# Patient Record
Sex: Female | Born: 1940 | Race: White | Hispanic: No
Health system: Southern US, Community
[De-identification: ages and names within clinical notes are randomized; demographics above are authoritative.]

---

## 2003-05-08 ENCOUNTER — Ambulatory Visit (HOSPITAL_COMMUNITY): Admission: RE | Admit: 2003-05-08 | Discharge: 2003-05-08 | Payer: Self-pay | Admitting: *Deleted

## 2004-12-11 ENCOUNTER — Ambulatory Visit: Payer: Self-pay

## 2005-01-12 ENCOUNTER — Ambulatory Visit: Payer: Self-pay | Admitting: Internal Medicine

## 2006-01-17 ENCOUNTER — Ambulatory Visit: Payer: Self-pay | Admitting: Internal Medicine

## 2006-05-05 ENCOUNTER — Ambulatory Visit: Payer: Self-pay

## 2006-06-02 ENCOUNTER — Ambulatory Visit: Payer: Self-pay | Admitting: Internal Medicine

## 2006-11-09 ENCOUNTER — Ambulatory Visit: Payer: Self-pay | Admitting: Unknown Physician Specialty

## 2006-12-06 ENCOUNTER — Ambulatory Visit: Payer: Self-pay | Admitting: Unknown Physician Specialty

## 2007-01-09 ENCOUNTER — Other Ambulatory Visit: Payer: Self-pay

## 2007-01-10 ENCOUNTER — Inpatient Hospital Stay: Payer: Self-pay | Admitting: Internal Medicine

## 2007-02-16 ENCOUNTER — Ambulatory Visit: Payer: Self-pay | Admitting: Unknown Physician Specialty

## 2007-05-12 ENCOUNTER — Emergency Department: Payer: Self-pay | Admitting: Emergency Medicine

## 2007-05-25 ENCOUNTER — Ambulatory Visit: Payer: Self-pay | Admitting: Unknown Physician Specialty

## 2007-06-21 ENCOUNTER — Inpatient Hospital Stay: Payer: Self-pay | Admitting: Unknown Physician Specialty

## 2008-01-03 ENCOUNTER — Ambulatory Visit: Payer: Self-pay | Admitting: Unknown Physician Specialty

## 2008-01-05 ENCOUNTER — Ambulatory Visit: Payer: Self-pay | Admitting: Unknown Physician Specialty

## 2008-02-07 ENCOUNTER — Ambulatory Visit: Payer: Self-pay | Admitting: Internal Medicine

## 2008-03-04 ENCOUNTER — Ambulatory Visit: Payer: Self-pay | Admitting: Unknown Physician Specialty

## 2008-05-07 ENCOUNTER — Ambulatory Visit: Payer: Self-pay | Admitting: Oncology

## 2008-05-23 ENCOUNTER — Ambulatory Visit: Payer: Self-pay | Admitting: Maternal and Fetal Medicine

## 2008-06-06 ENCOUNTER — Ambulatory Visit: Payer: Self-pay | Admitting: Maternal and Fetal Medicine

## 2008-06-06 ENCOUNTER — Ambulatory Visit: Payer: Self-pay | Admitting: Oncology

## 2008-06-26 ENCOUNTER — Ambulatory Visit: Payer: Self-pay | Admitting: Unknown Physician Specialty

## 2008-07-10 ENCOUNTER — Ambulatory Visit: Payer: Self-pay | Admitting: Unknown Physician Specialty

## 2008-11-25 IMAGING — CT CT ABDOMEN WO/W CM
2 of 4 series · 14 of 32 positions shown, 19 images · non-contrast
Comparison: none

REASON FOR EXAM: left adrenal adenoma
COMMENTS:

[Series 2: soft tissue w/o · axial · non-contrast · 0.80mm/px · z∈[+240,+476]mm · 8 of 61 slices shown, 13 images]
[im 7/61  soft-tissue]
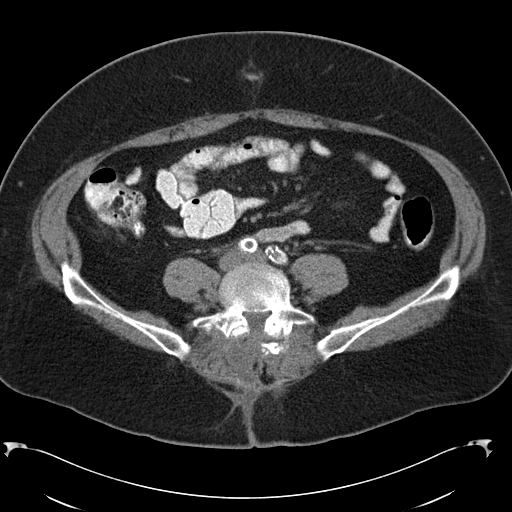
[im 7/61  bone]
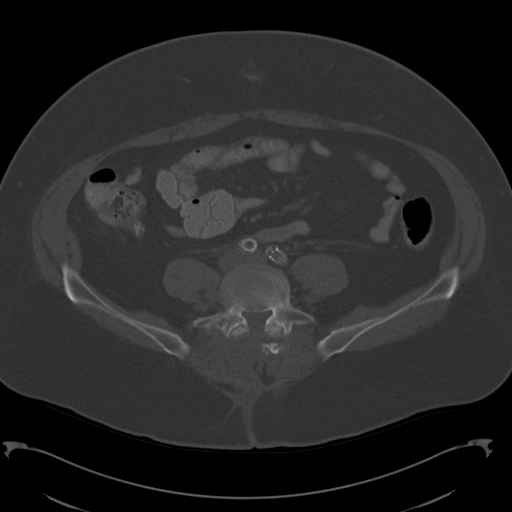
[im 14/61  soft-tissue]
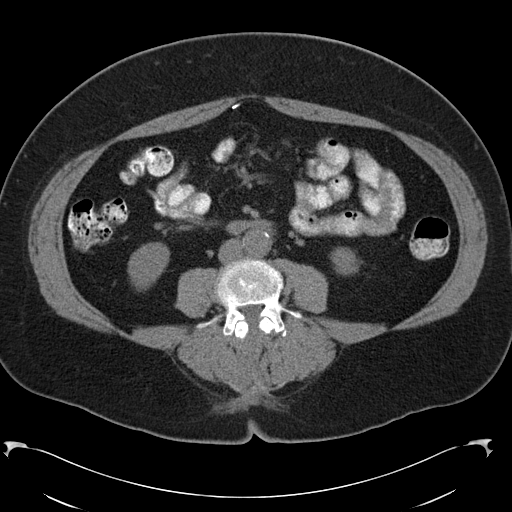
[im 21/61  soft-tissue]
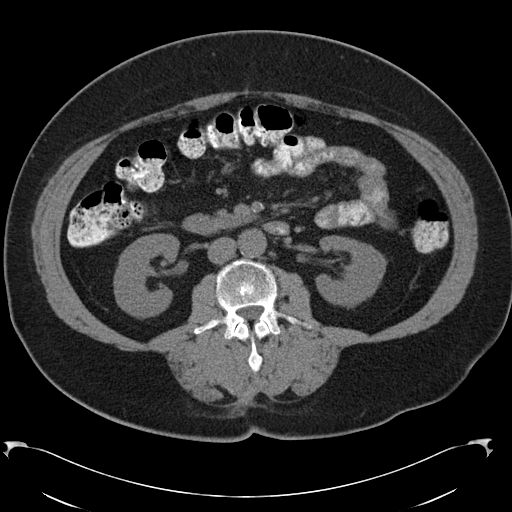
[im 27/61  soft-tissue]
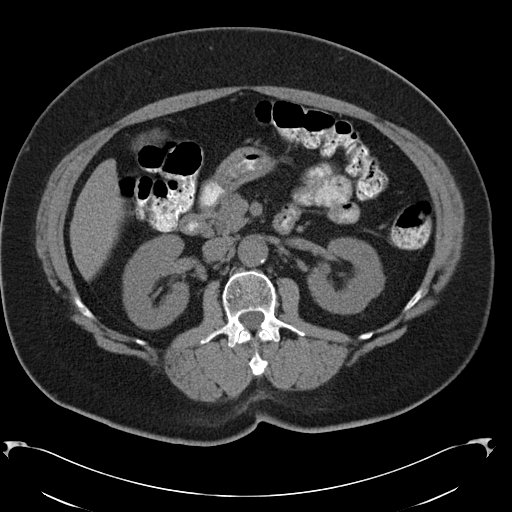
[im 34/61  soft-tissue]
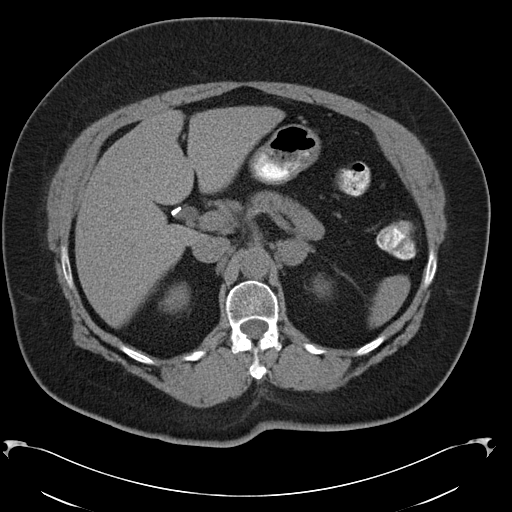
[im 34/61  lung]
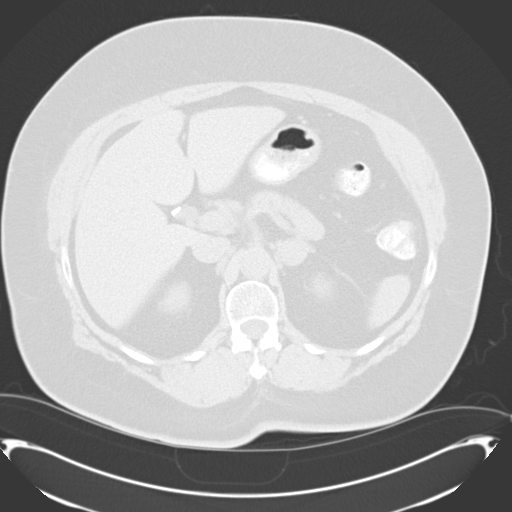
[im 41/61  soft-tissue]
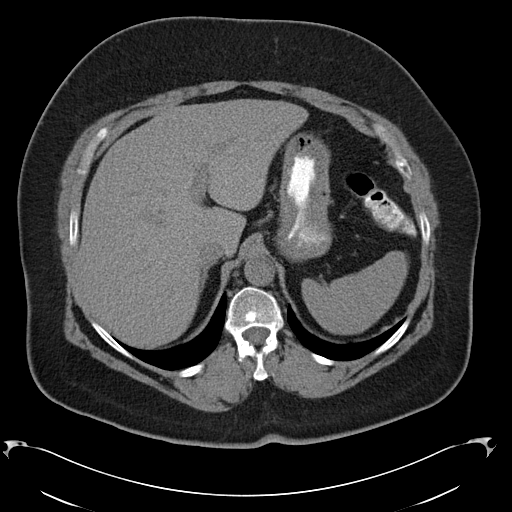
[im 41/61  lung]
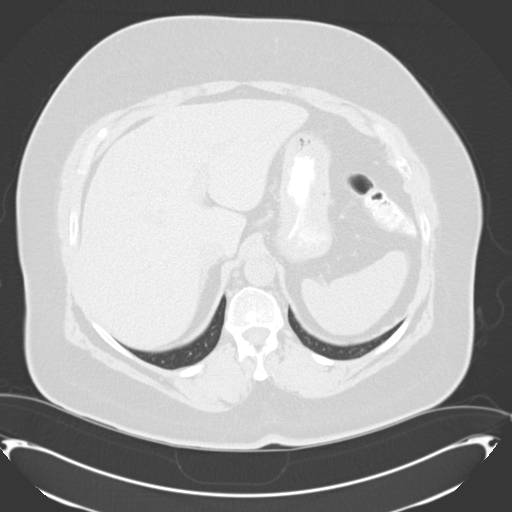
[im 47/61  soft-tissue]
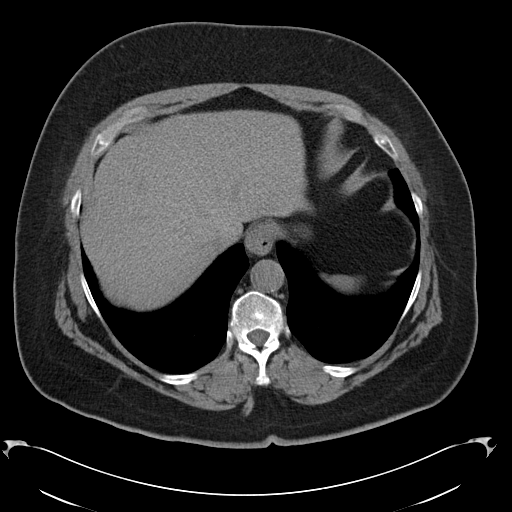
[im 47/61  lung]
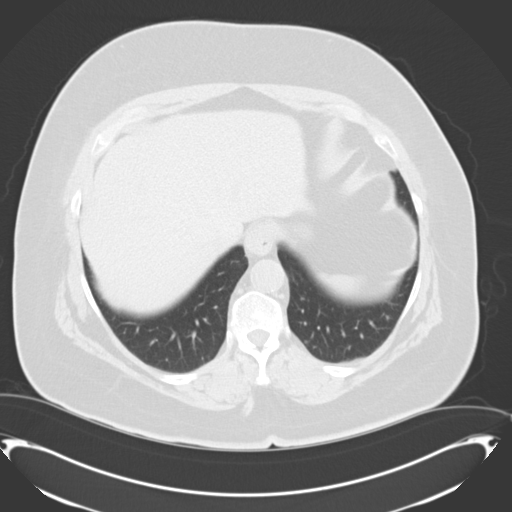
[im 54/61  soft-tissue]
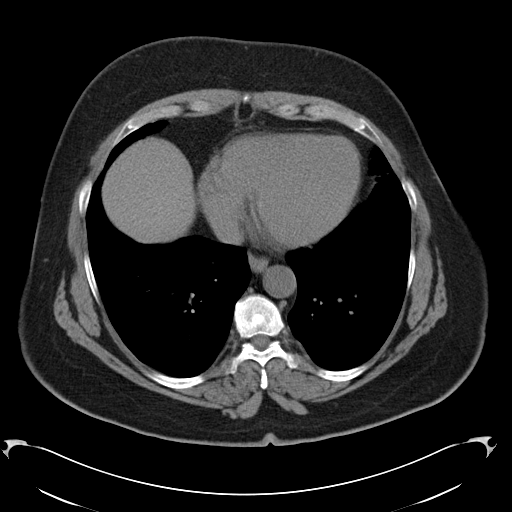
[im 54/61  lung]
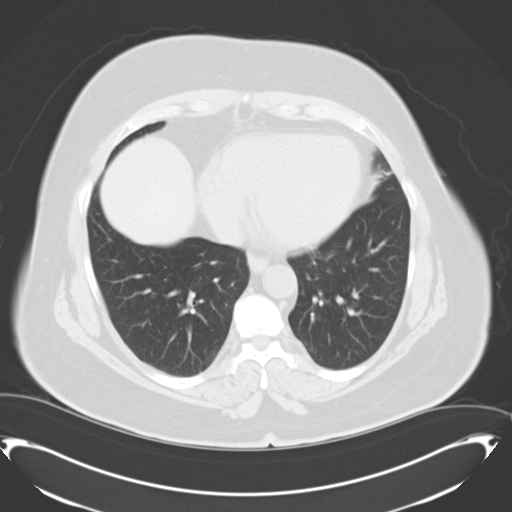

[Series 4: soft tissue with · axial · 0.80mm/px · z∈[+240,+430]mm · 6 of 62 slices shown]
[im 8/62  soft-tissue]
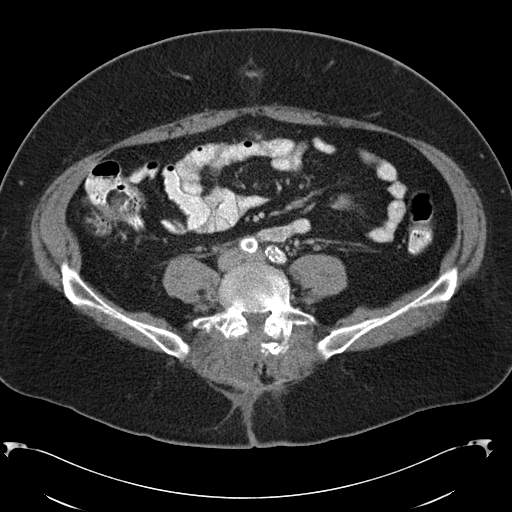
[im 16/62  soft-tissue]
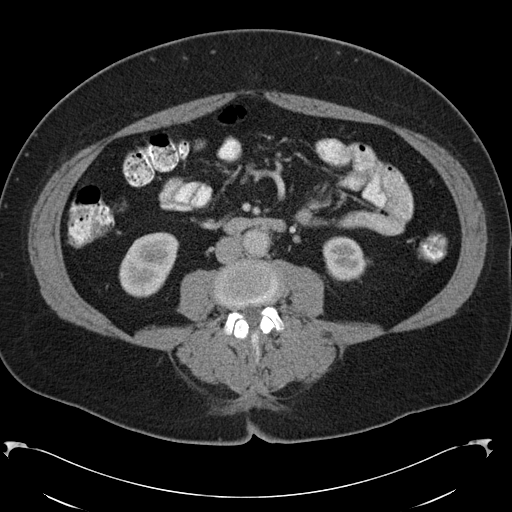
[im 23/62  soft-tissue]
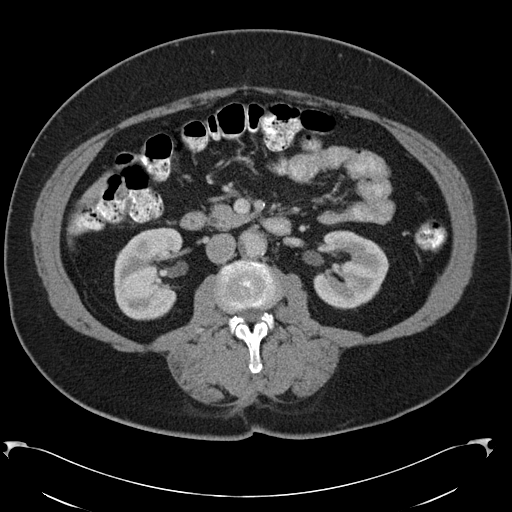
[im 31/62  soft-tissue]
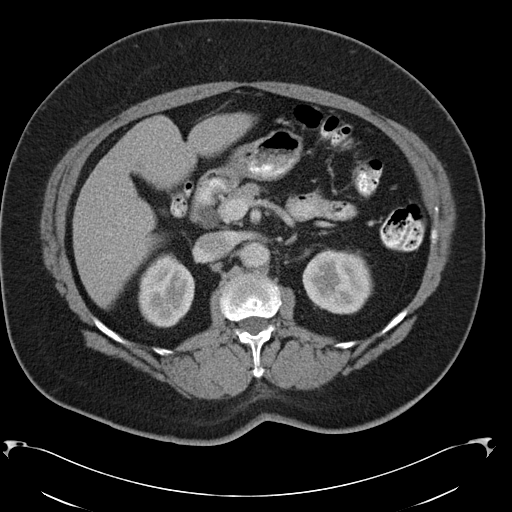
[im 39/62  soft-tissue]
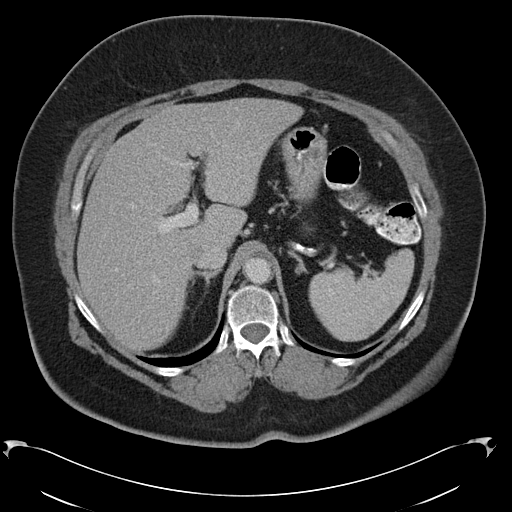
[im 46/62  soft-tissue]
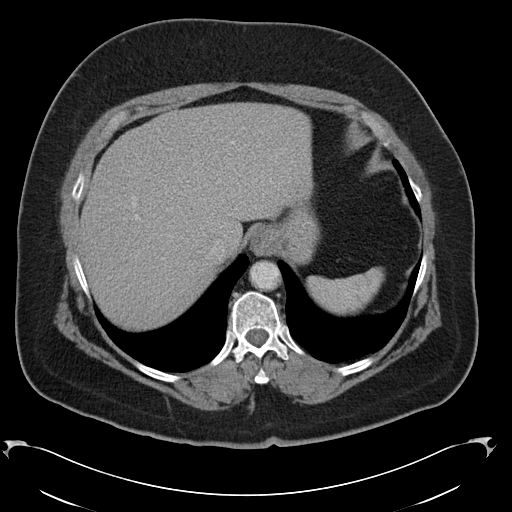

[14 of 32 positions shown; findings below may reference images not displayed]

PROCEDURE:     CT  - CT ABDOMEN STANDARD W/WO  - March 04, 2008  [DATE]

RESULT:      The patient undergoing imaging for presumed LEFT adrenal
adenoma.

On noncontrast images, there is a LEFT adrenal mass measuring 2.3 cm in
diameter. This has Hounsfield measurements of 23 precontrast, 72 post
contrast, and 35 on delayed images. It appears no more than slightly larger
than on a study 05 May, 2006. At that time its Hounsfield measurement
was approximately 30 on a standard contrast enhanced image.

The RIGHT adrenal gland is normal in appearance. The liver, spleen, stomach,
pancreas, and kidneys exhibit no acute abnormality. There is cortically
based hypodensity in the LEFT  kidney which is most compatible with an
approximately 8 x 12 mm diameter cyst with Hounsfield measurements of 5
precontrast, 23 postcontrast, and 17 on delayed images. The partially
contrast filled loops of small and large bowel are normal in appearance. The
patient has undergone placement of common iliac artery stents bilaterally.
The caliber of the abdominal aorta is normal.
IMPRESSION: 1. There is a stable appearing mass involving the LEFT adrenal gland. It
statistically is most compatible with an adrenal adenoma.
2. I do not see acute hepatobiliary abnormality. The gallbladder is
surgically absent and mild dilation of the common bile duct is seen which is
not unusual in the postcholecystectomy state.
3. There are findings most compatible with a cyst in the mid to upper pole
of the LEFT kidney laterally.
4. The lung bases are clear.

## 2009-05-07 ENCOUNTER — Ambulatory Visit: Payer: Self-pay | Admitting: Internal Medicine

## 2009-05-20 ENCOUNTER — Ambulatory Visit: Payer: Self-pay | Admitting: Internal Medicine

## 2010-02-14 ENCOUNTER — Ambulatory Visit: Payer: Self-pay | Admitting: Internal Medicine

## 2010-02-17 ENCOUNTER — Ambulatory Visit: Payer: Self-pay | Admitting: Internal Medicine

## 2010-05-20 ENCOUNTER — Ambulatory Visit: Payer: Self-pay | Admitting: Pain Medicine

## 2010-05-26 ENCOUNTER — Ambulatory Visit: Payer: Self-pay | Admitting: Internal Medicine

## 2010-05-27 ENCOUNTER — Ambulatory Visit: Payer: Self-pay | Admitting: Pain Medicine

## 2010-05-27 ENCOUNTER — Other Ambulatory Visit: Payer: Self-pay | Admitting: Pain Medicine

## 2010-06-09 ENCOUNTER — Ambulatory Visit: Payer: Self-pay | Admitting: Internal Medicine

## 2010-06-25 ENCOUNTER — Ambulatory Visit: Payer: Self-pay | Admitting: Pain Medicine

## 2010-08-04 ENCOUNTER — Ambulatory Visit: Payer: Self-pay | Admitting: Surgery

## 2010-08-11 ENCOUNTER — Ambulatory Visit: Payer: Self-pay | Admitting: Surgery

## 2010-08-13 LAB — PATHOLOGY REPORT

## 2011-03-20 ENCOUNTER — Inpatient Hospital Stay: Payer: Self-pay | Admitting: Internal Medicine

## 2011-12-11 IMAGING — CR DG CHEST 1V PORT
1 series · 1 of 1 positions shown · non-contrast
Comparison: none

REASON FOR EXAM: CP
COMMENTS:

[view not recorded]
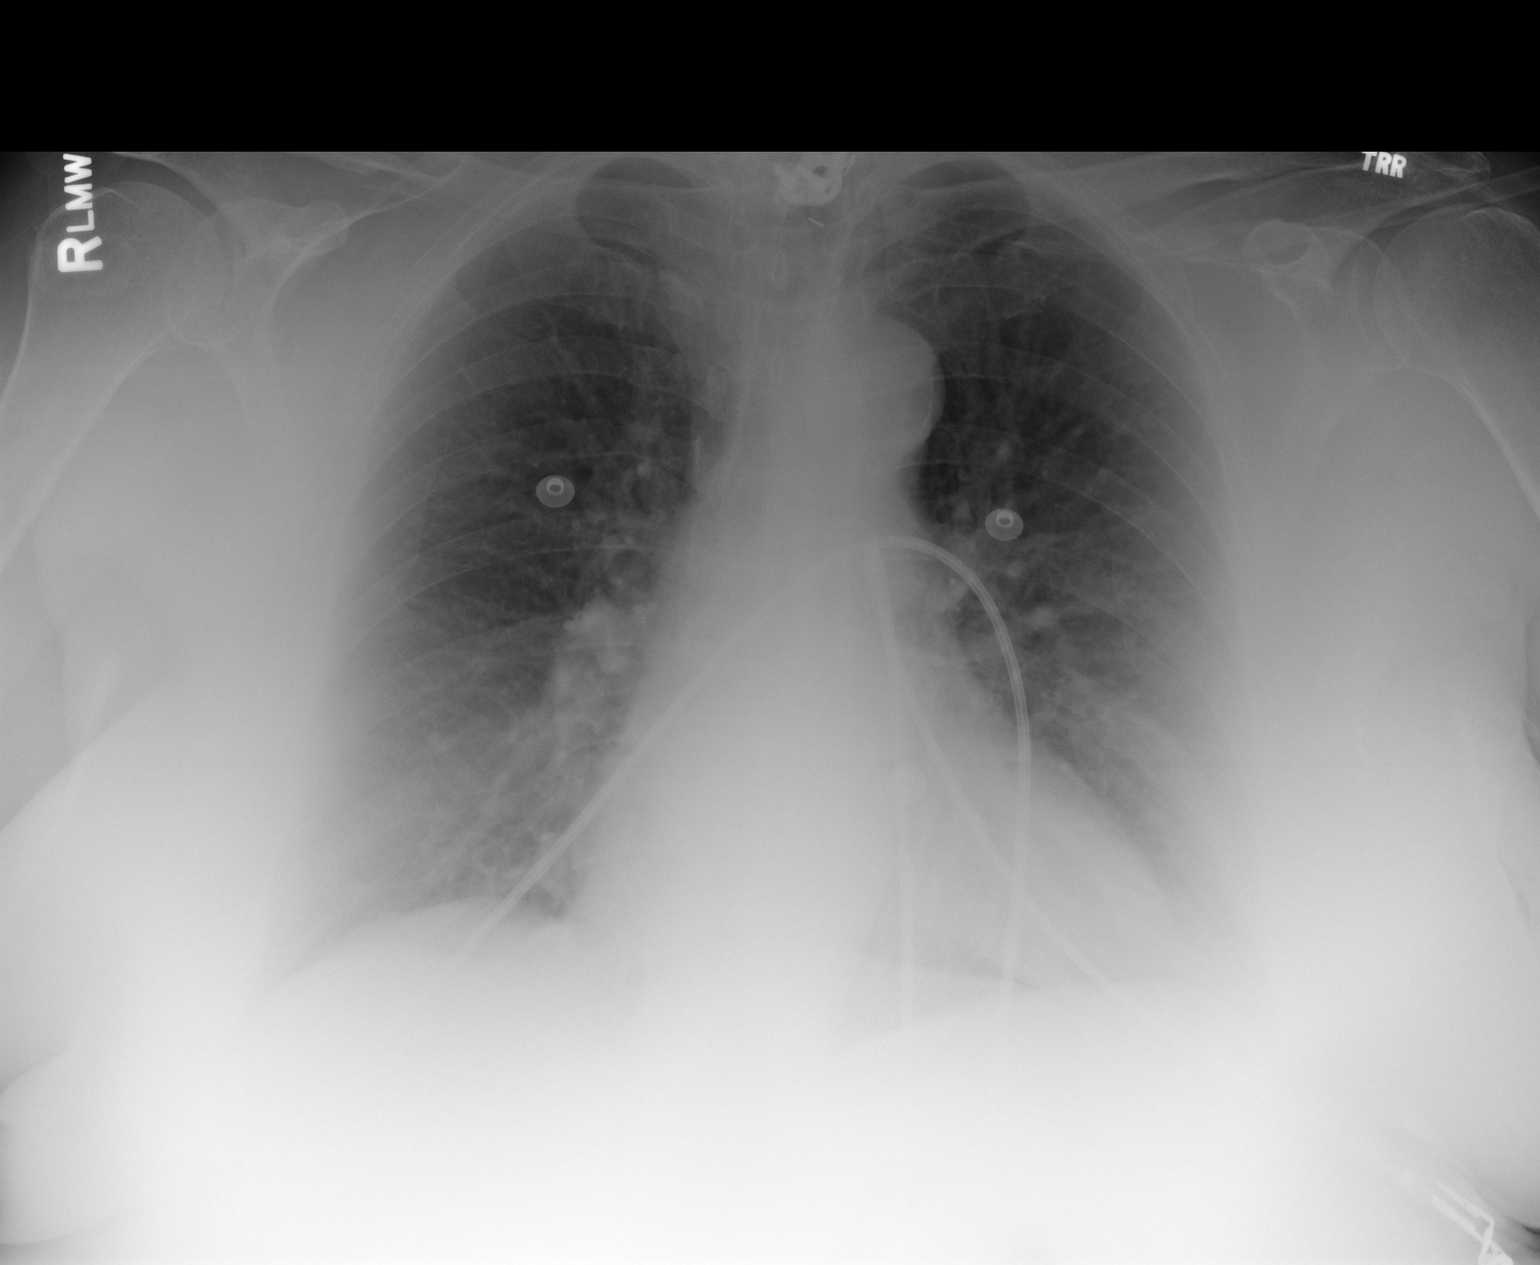

[1 of 1 positions shown; findings below may reference images not displayed]

PROCEDURE:     DXR - DXR PORTABLE CHEST SINGLE VIEW  - March 20, 2011 [DATE]

RESULT:     Comparison is made to the study 03 April, 2010.

The lungs are well expanded. The pulmonary interstitial markings are mildly
prominent as is the central pulmonary vascularity. The cardiac silhouette is
top normal in size. I see no pleural effusion nor alveolar infiltrate.
IMPRESSION: The study is somewhat limited due to the portable technique
and overlying soft tissues. However, I cannot exclude low-grade CHF in the
appropriate clinical setting. A followup PA and lateral chest x-ray would be
of value.

## 2012-01-17 ENCOUNTER — Emergency Department: Payer: Self-pay | Admitting: *Deleted

## 2012-01-17 LAB — COMPREHENSIVE METABOLIC PANEL
BUN: 24 mg/dL — ABNORMAL HIGH (ref 7–18)
Bilirubin,Total: 0.4 mg/dL (ref 0.2–1.0)
Chloride: 105 mmol/L (ref 98–107)
Creatinine: 1.5 mg/dL — ABNORMAL HIGH (ref 0.60–1.30)
EGFR (African American): 40 — ABNORMAL LOW
EGFR (Non-African Amer.): 34 — ABNORMAL LOW
Glucose: 199 mg/dL — ABNORMAL HIGH (ref 65–99)
Osmolality: 291 (ref 275–301)
SGPT (ALT): 24 U/L
Sodium: 141 mmol/L (ref 136–145)
Total Protein: 7.4 g/dL (ref 6.4–8.2)

## 2012-01-17 LAB — URINALYSIS, COMPLETE
Bacteria: NONE SEEN
Ketone: NEGATIVE
Leukocyte Esterase: NEGATIVE
Nitrite: NEGATIVE
Protein: >=500
Specific Gravity: 1.012 (ref 1.003–1.030)
Squamous Epithelial: 1
WBC UR: 2 /HPF (ref 0–5)

## 2012-01-17 LAB — CBC
HGB: 14.8 g/dL (ref 12.0–16.0)
MCV: 88 fL (ref 80–100)
Platelet: 202 10*3/uL (ref 150–440)
RDW: 14.9 % — ABNORMAL HIGH (ref 11.5–14.5)
WBC: 8.7 10*3/uL (ref 3.6–11.0)

## 2012-01-17 LAB — LIPASE, BLOOD: Lipase: 221 U/L (ref 73–393)

## 2012-03-27 ENCOUNTER — Ambulatory Visit: Payer: Self-pay | Admitting: Internal Medicine

## 2012-04-06 ENCOUNTER — Ambulatory Visit: Payer: Self-pay | Admitting: Internal Medicine

## 2012-05-07 ENCOUNTER — Ambulatory Visit: Payer: Self-pay | Admitting: Internal Medicine

## 2012-08-08 ENCOUNTER — Ambulatory Visit: Payer: Self-pay | Admitting: Unknown Physician Specialty

## 2012-08-25 ENCOUNTER — Ambulatory Visit: Payer: Self-pay

## 2013-06-05 ENCOUNTER — Inpatient Hospital Stay: Payer: Self-pay | Admitting: Internal Medicine

## 2013-06-05 LAB — COMPREHENSIVE METABOLIC PANEL
Albumin: 3 g/dL — ABNORMAL LOW (ref 3.4–5.0)
Anion Gap: 8 (ref 7–16)
BUN: 47 mg/dL — ABNORMAL HIGH (ref 7–18)
Bilirubin,Total: 0.4 mg/dL (ref 0.2–1.0)
Calcium, Total: 8.7 mg/dL (ref 8.5–10.1)
Chloride: 99 mmol/L (ref 98–107)
Co2: 25 mmol/L (ref 21–32)
Creatinine: 2.12 mg/dL — ABNORMAL HIGH (ref 0.60–1.30)
EGFR (African American): 26 — ABNORMAL LOW
EGFR (Non-African Amer.): 23 — ABNORMAL LOW
Glucose: 176 mg/dL — ABNORMAL HIGH (ref 65–99)
Potassium: 4.4 mmol/L (ref 3.5–5.1)
SGOT(AST): 25 U/L (ref 15–37)
SGPT (ALT): 101 U/L — ABNORMAL HIGH (ref 12–78)
Sodium: 132 mmol/L — ABNORMAL LOW (ref 136–145)
Total Protein: 6.4 g/dL (ref 6.4–8.2)

## 2013-06-05 LAB — URINALYSIS, COMPLETE
Bilirubin,UR: NEGATIVE
Hyaline Cast: 2
Ketone: NEGATIVE
Leukocyte Esterase: NEGATIVE
Nitrite: NEGATIVE
Ph: 5 (ref 4.5–8.0)
Specific Gravity: 1.016 (ref 1.003–1.030)
Squamous Epithelial: 2
WBC UR: 6 /HPF (ref 0–5)

## 2013-06-05 LAB — CBC
HCT: 42.7 % (ref 35.0–47.0)
HGB: 14.3 g/dL (ref 12.0–16.0)
MCH: 28.8 pg (ref 26.0–34.0)
MCHC: 33.4 g/dL (ref 32.0–36.0)
MCV: 86 fL (ref 80–100)
RDW: 14 % (ref 11.5–14.5)
WBC: 11 10*3/uL (ref 3.6–11.0)

## 2013-06-05 LAB — LIPASE, BLOOD: Lipase: 720 U/L — ABNORMAL HIGH (ref 73–393)

## 2013-06-05 LAB — CK TOTAL AND CKMB (NOT AT ARMC)
CK, Total: 117 U/L (ref 21–215)
CK-MB: 3.3 ng/mL (ref 0.5–3.6)

## 2013-06-06 LAB — CBC WITH DIFFERENTIAL/PLATELET
Basophil #: 0 10*3/uL (ref 0.0–0.1)
Basophil %: 0.2 %
Eosinophil %: 1.7 %
HCT: 41.8 % (ref 35.0–47.0)
HGB: 13.7 g/dL (ref 12.0–16.0)
MCH: 28.7 pg (ref 26.0–34.0)
MCV: 88 fL (ref 80–100)
Neutrophil %: 71.5 %
Platelet: 102 10*3/uL — ABNORMAL LOW (ref 150–440)
RBC: 4.78 10*6/uL (ref 3.80–5.20)
WBC: 11.4 10*3/uL — ABNORMAL HIGH (ref 3.6–11.0)

## 2013-06-06 LAB — BASIC METABOLIC PANEL
Anion Gap: 8 (ref 7–16)
Calcium, Total: 8.2 mg/dL — ABNORMAL LOW (ref 8.5–10.1)
Chloride: 103 mmol/L (ref 98–107)
Co2: 23 mmol/L (ref 21–32)
Creatinine: 1.95 mg/dL — ABNORMAL HIGH (ref 0.60–1.30)
EGFR (African American): 29 — ABNORMAL LOW
EGFR (Non-African Amer.): 25 — ABNORMAL LOW
Potassium: 4.6 mmol/L (ref 3.5–5.1)

## 2013-06-06 LAB — TSH: Thyroid Stimulating Horm: 4.18 u[IU]/mL

## 2013-06-06 LAB — MAGNESIUM: Magnesium: 2.2 mg/dL

## 2013-06-27 ENCOUNTER — Ambulatory Visit: Payer: Self-pay | Admitting: Internal Medicine

## 2013-07-07 ENCOUNTER — Ambulatory Visit: Payer: Self-pay | Admitting: Internal Medicine

## 2013-09-13 ENCOUNTER — Ambulatory Visit: Payer: Self-pay | Admitting: Obstetrics and Gynecology

## 2013-09-13 DIAGNOSIS — I10 Essential (primary) hypertension: Secondary | ICD-10-CM

## 2013-09-13 LAB — CBC
HCT: 38 % (ref 35.0–47.0)
HGB: 12.6 g/dL (ref 12.0–16.0)
MCH: 28.3 pg (ref 26.0–34.0)
MCHC: 33.2 g/dL (ref 32.0–36.0)
MCV: 85 fL (ref 80–100)
PLATELETS: 223 10*3/uL (ref 150–440)
RBC: 4.47 10*6/uL (ref 3.80–5.20)
RDW: 14.6 % — AB (ref 11.5–14.5)
WBC: 8 10*3/uL (ref 3.6–11.0)

## 2013-09-13 LAB — BASIC METABOLIC PANEL
Anion Gap: 5 — ABNORMAL LOW (ref 7–16)
BUN: 39 mg/dL — ABNORMAL HIGH (ref 7–18)
CALCIUM: 9.3 mg/dL (ref 8.5–10.1)
Chloride: 102 mmol/L (ref 98–107)
Co2: 27 mmol/L (ref 21–32)
Creatinine: 2.11 mg/dL — ABNORMAL HIGH (ref 0.60–1.30)
GFR CALC AF AMER: 26 — AB
GFR CALC NON AF AMER: 23 — AB
Glucose: 152 mg/dL — ABNORMAL HIGH (ref 65–99)
Osmolality: 281 (ref 275–301)
Potassium: 4.1 mmol/L (ref 3.5–5.1)
SODIUM: 134 mmol/L — AB (ref 136–145)

## 2013-09-13 LAB — PROTIME-INR
INR: 1.1
Prothrombin Time: 13.6 secs (ref 11.5–14.7)

## 2013-09-13 LAB — TSH: THYROID STIMULATING HORM: 5.55 u[IU]/mL — AB

## 2013-09-13 LAB — T4, FREE: Free Thyroxine: 0.99 ng/dL (ref 0.76–1.46)

## 2013-09-13 LAB — APTT: Activated PTT: 27.9 secs (ref 23.6–35.9)

## 2013-09-17 ENCOUNTER — Ambulatory Visit: Payer: Self-pay | Admitting: Obstetrics and Gynecology

## 2013-09-20 LAB — PATHOLOGY REPORT

## 2013-10-24 ENCOUNTER — Ambulatory Visit: Payer: Self-pay | Admitting: Ophthalmology

## 2014-03-06 ENCOUNTER — Inpatient Hospital Stay: Payer: Self-pay | Admitting: Internal Medicine

## 2014-03-06 ENCOUNTER — Ambulatory Visit: Payer: Self-pay | Admitting: Gynecologic Oncology

## 2014-03-06 LAB — URINALYSIS, COMPLETE
Bilirubin,UR: NEGATIVE
Glucose,UR: >=500 mg/dL (ref 0–75)
Hyaline Cast: 9
Ketone: NEGATIVE
NITRITE: NEGATIVE
Ph: 5 (ref 4.5–8.0)
Protein: >=500
Specific Gravity: 1.018 (ref 1.003–1.030)
Squamous Epithelial: 5
WBC UR: 53 /HPF (ref 0–5)

## 2014-03-06 LAB — CBC
HCT: 49.1 % — ABNORMAL HIGH (ref 35.0–47.0)
HGB: 15.3 g/dL (ref 12.0–16.0)
MCH: 27.7 pg (ref 26.0–34.0)
MCHC: 31.2 g/dL — ABNORMAL LOW (ref 32.0–36.0)
MCV: 89 fL (ref 80–100)
PLATELETS: 263 10*3/uL (ref 150–440)
RBC: 5.53 10*6/uL — ABNORMAL HIGH (ref 3.80–5.20)
RDW: 13.9 % (ref 11.5–14.5)
WBC: 15.6 10*3/uL — AB (ref 3.6–11.0)

## 2014-03-06 LAB — BASIC METABOLIC PANEL
ANION GAP: 10 (ref 7–16)
BUN: 38 mg/dL — ABNORMAL HIGH (ref 7–18)
CALCIUM: 9.1 mg/dL (ref 8.5–10.1)
CO2: 24 mmol/L (ref 21–32)
Chloride: 99 mmol/L (ref 98–107)
Creatinine: 2.37 mg/dL — ABNORMAL HIGH (ref 0.60–1.30)
GFR CALC AF AMER: 23 — AB
GFR CALC NON AF AMER: 20 — AB
Glucose: 340 mg/dL — ABNORMAL HIGH (ref 65–99)
Osmolality: 289 (ref 275–301)
Potassium: 4.3 mmol/L (ref 3.5–5.1)
Sodium: 133 mmol/L — ABNORMAL LOW (ref 136–145)

## 2014-03-06 LAB — TROPONIN I: TROPONIN-I: 0.08 ng/mL — AB

## 2014-03-07 LAB — HEPATIC FUNCTION PANEL A (ARMC)
ALBUMIN: 3.4 g/dL (ref 3.4–5.0)
ALT: 22 U/L (ref 12–78)
AST: 7 U/L — AB (ref 15–37)
Alkaline Phosphatase: 80 U/L
Bilirubin,Total: 0.5 mg/dL (ref 0.2–1.0)
Total Protein: 6.9 g/dL (ref 6.4–8.2)

## 2014-03-07 LAB — LIPID PANEL
CHOLESTEROL: 278 mg/dL — AB (ref 0–200)
HDL Cholesterol: 35 mg/dL — ABNORMAL LOW (ref 40–60)
LDL CHOLESTEROL, CALC: 186 mg/dL — AB (ref 0–100)
Triglycerides: 286 mg/dL — ABNORMAL HIGH (ref 0–200)
VLDL CHOLESTEROL, CALC: 57 mg/dL — AB (ref 5–40)

## 2014-03-07 LAB — TROPONIN I
TROPONIN-I: 0.07 ng/mL — AB
TROPONIN-I: 0.07 ng/mL — AB

## 2014-03-07 LAB — CK-MB
CK-MB: 1.6 ng/mL (ref 0.5–3.6)
CK-MB: 1.6 ng/mL (ref 0.5–3.6)
CK-MB: 1.8 ng/mL (ref 0.5–3.6)

## 2014-03-08 LAB — TROPONIN I: TROPONIN-I: 0.04 ng/mL

## 2014-03-08 LAB — COMPREHENSIVE METABOLIC PANEL
AST: 6 U/L — AB (ref 15–37)
Albumin: 2.9 g/dL — ABNORMAL LOW (ref 3.4–5.0)
Alkaline Phosphatase: 69 U/L
Anion Gap: 10 (ref 7–16)
BUN: 41 mg/dL — ABNORMAL HIGH (ref 7–18)
Bilirubin,Total: 0.4 mg/dL (ref 0.2–1.0)
CREATININE: 2.17 mg/dL — AB (ref 0.60–1.30)
Calcium, Total: 8 mg/dL — ABNORMAL LOW (ref 8.5–10.1)
Chloride: 104 mmol/L (ref 98–107)
Co2: 23 mmol/L (ref 21–32)
EGFR (African American): 25 — ABNORMAL LOW
GFR CALC NON AF AMER: 22 — AB
GLUCOSE: 236 mg/dL — AB (ref 65–99)
OSMOLALITY: 292 (ref 275–301)
Potassium: 4.3 mmol/L (ref 3.5–5.1)
SGPT (ALT): 21 U/L (ref 12–78)
Sodium: 137 mmol/L (ref 136–145)
Total Protein: 6.1 g/dL — ABNORMAL LOW (ref 6.4–8.2)

## 2014-03-08 LAB — CBC WITH DIFFERENTIAL/PLATELET
BASOS ABS: 0 10*3/uL (ref 0.0–0.1)
BASOS PCT: 0.2 %
EOS PCT: 1.3 %
Eosinophil #: 0.1 10*3/uL (ref 0.0–0.7)
HCT: 36.3 % (ref 35.0–47.0)
HGB: 11.8 g/dL — AB (ref 12.0–16.0)
LYMPHS PCT: 23.8 %
Lymphocyte #: 2.3 10*3/uL (ref 1.0–3.6)
MCH: 29 pg (ref 26.0–34.0)
MCHC: 32.5 g/dL (ref 32.0–36.0)
MCV: 89 fL (ref 80–100)
MONO ABS: 0.7 x10 3/mm (ref 0.2–0.9)
MONOS PCT: 7.2 %
NEUTROS ABS: 6.5 10*3/uL (ref 1.4–6.5)
NEUTROS PCT: 67.5 %
Platelet: 179 10*3/uL (ref 150–440)
RBC: 4.06 10*6/uL (ref 3.80–5.20)
RDW: 13.8 % (ref 11.5–14.5)
WBC: 9.6 10*3/uL (ref 3.6–11.0)

## 2014-03-08 LAB — PROTIME-INR
INR: 1.1
Prothrombin Time: 14.2 secs (ref 11.5–14.7)

## 2014-03-08 LAB — APTT: Activated PTT: 26.9 secs (ref 23.6–35.9)

## 2014-03-08 LAB — URINE CULTURE

## 2014-03-11 LAB — CANCER ANTIGEN 27.29: CA 27.29: 17.6 U/mL (ref 0.0–38.6)

## 2014-03-11 LAB — AFP TUMOR MARKER: AFP-Tumor Marker: 1.9 ng/mL (ref 0.0–8.3)

## 2014-03-12 LAB — CULTURE, BLOOD (SINGLE)

## 2014-03-18 ENCOUNTER — Ambulatory Visit: Payer: Self-pay | Admitting: Internal Medicine

## 2014-03-19 ENCOUNTER — Ambulatory Visit: Payer: Self-pay | Admitting: Internal Medicine

## 2014-03-29 ENCOUNTER — Ambulatory Visit: Payer: Self-pay | Admitting: Internal Medicine

## 2014-03-29 LAB — CBC WITH DIFFERENTIAL/PLATELET
BASOS PCT: 0.8 %
Basophil #: 0.1 10*3/uL (ref 0.0–0.1)
Eosinophil #: 0.3 10*3/uL (ref 0.0–0.7)
Eosinophil %: 4.1 %
HCT: 35.1 % (ref 35.0–47.0)
HGB: 11.4 g/dL — AB (ref 12.0–16.0)
Lymphocyte #: 2.1 10*3/uL (ref 1.0–3.6)
Lymphocyte %: 27.9 %
MCH: 29 pg (ref 26.0–34.0)
MCHC: 32.3 g/dL (ref 32.0–36.0)
MCV: 90 fL (ref 80–100)
MONOS PCT: 10.5 %
Monocyte #: 0.8 x10 3/mm (ref 0.2–0.9)
NEUTROS PCT: 56.7 %
Neutrophil #: 4.3 10*3/uL (ref 1.4–6.5)
Platelet: 244 10*3/uL (ref 150–440)
RBC: 3.92 10*6/uL (ref 3.80–5.20)
RDW: 14.3 % (ref 11.5–14.5)
WBC: 7.6 10*3/uL (ref 3.6–11.0)

## 2014-03-29 LAB — PROTIME-INR
INR: 1
PROTHROMBIN TIME: 13.5 s (ref 11.5–14.7)

## 2014-04-02 LAB — PATHOLOGY REPORT

## 2014-04-06 ENCOUNTER — Ambulatory Visit: Payer: Self-pay | Admitting: Gynecologic Oncology

## 2014-04-10 ENCOUNTER — Ambulatory Visit: Payer: Self-pay | Admitting: Internal Medicine

## 2014-04-12 LAB — PATHOLOGY REPORT

## 2014-10-07 DEATH — deceased

## 2014-12-27 NOTE — Consult Note (Signed)
PATIENT NAME:  Lauren DonathRUTHERFORD, Mirel C MR#:  [id number] DATE OF BIRTH:  July 08, 1941  DATE OF CONSULTATION:  06/06/2013  REFERRING PHYSICIAN: Dr. Imogene Burnhen.  CONSULTING PHYSICIAN:  Marcina MillardAlexander Jamarco Zaldivar, MD  PRIMARY CARE PHYSICIAN: Bethann PunchesMark Miller, MD.  CHIEF COMPLAINT: Weakness.   REASON FOR CONSULTATION: Consultation requested for evaluation of atrial fibrillation.   HISTORY OF PRESENT ILLNESS: The patient is a 74 year old female with history of type 1 diabetes. [facility] with a 6-day history of generalized weakness and dehydration. The patient was feeling nauseous with vomiting. She had decreased p.o. intake. The patient reports that she was experiencing psychotic episodes thinking that someone was trying to poison her. The patient is a psychiatric nurse by trade.   PAST MEDICAL HISTORY:  1.  Type 1 diabetes.  2.  Peripheral vascular disease, status post stent left femoral artery.  3.  Hyperlipidemia.  4.  Hypothyroidism.  5.  Factor V Leiden coagulopathy. 6.  Hepatitis. 7.  History of left renal adenoma. 8.  Left breast cancer, status post radiation therapy. 9.  Hypertension.   MEDICATIONS: Lisinopril 20 mg daily, clonidine 0.3 mg b.i.d., vitamin D3 5000 international units daily, Percocet 5/325 mg 1 b.i.d. and Humalog insulin pump.   SOCIAL HISTORY: The patient denies tobacco or EtOH abuse.   FAMILY HISTORY: No immediate family history of coronary artery disease or myocardial infarction.   REVIEW OF SYSTEMS: CONSTITUTIONAL: No fever or chills. EYES: No blurry vision. EARS: No hearing loss. RESPIRATORY: The patient denies shortness of breath. CARDIOVASCULAR: The patient denies chest pain, but does have a history of palpitations and heart racing. GENITOURINARY: No dysuria or hematuria. ENDOCRINE: No polyuria or polydipsia. MUSCULOSKELETAL: No arthralgias or myalgias. NEUROLOGICAL: No focal muscle numbness. PSYCHOLOGICAL: No depression or anxiety.   PHYSICAL EXAMINATION:   VITAL SIGNS: Blood pressure 176/69, pulse 94, respirations 18, temperature 98.4, and pulse oximetry 94%.  HEENT: Pupils equal, reactive to light and accommodation.  NECK: Supple without thyromegaly.  LUNGS: Clear.  HEART: Normal JVP. Normal PMI. Regular rate and rhythm. Normal S1, S2. No appreciable gallop, murmur, or rub.  ABDOMEN: Soft and nontender. Pulses were intact bilaterally.  MUSCULOSKELETAL: Normal muscle tone.  NEUROLOGIC: The patient is alert and oriented x 3. Motor and sensory both grossly intact.   IMPRESSION: A 74 year old female, who presents with generalized weakness, dehydration and psychotic episode, who experienced atrial fibrillation with low blood pressure, which he responded to digoxin load. The patient currently in sinus rhythm, blood pressure elevated. The patient has a CHADS2 score of 1, currently on aspirin.   RECOMMENDATIONS:  1.  Agree with overall current therapy.  2.  Continue aspirin for stroke prevention.  3.  Add metoprolol succinate 50 mg for rate control of atrial fibrillation as well as for hypertension. 4.  Review 2D echocardiogram.  5.  Continue low dose digoxin for now.   ____________________________ Marcina MillardAlexander Harmonee Tozer, MD ap:aw D: 06/06/2013 07:45:24 ET T: 06/06/2013 08:02:38 ET JOB#: [id number]  cc: Marcina MillardAlexander Roseline Ebarb, MD, <Dictator> Marcina MillardALEXANDER Delaynee Alred MD ELECTRONICALLY SIGNED 06/18/2013 11:26

## 2014-12-27 NOTE — Discharge Summary (Signed)
PATIENT NAME:  Lauren Moyer, Lauren Moyer MR#:  [id number] DATE OF BIRTH:  February 18, 1941  DATE OF ADMISSION:  06/05/2013 DATE OF DISCHARGE:  06/06/2013  Addendum:  DISCHARGE MEDICATIONS: Toprol-XL 50 mg daily.     ____________________________ Danella PentonMark F. Rehana Uncapher, MD mfm:dp D: 06/06/2013 08:16:55 ET T: 06/06/2013 08:53:09 ET JOB#: [id number]  cc: Danella PentonMark F. Carlea Badour, MD, <Dictator> Josie Mesa Sherlene ShamsF Jalexus Brett MD ELECTRONICALLY SIGNED 06/08/2013 7:47

## 2014-12-27 NOTE — H&P (Signed)
PATIENT NAME:  Lauren Moyer, Lauren Moyer MR#:  [id number] DATE OF BIRTH:  06/14/1941  DATE OF ADMISSION:  06/05/2013  PRIMARY CARE PHYSICIAN: Danella Penton, MD  REFERRING PHYSICIAN: Maurilio Lovely, MD   CHIEF COMPLAINT: Dehydration and weakness for 6 days.   HISTORY OF PRESENT ILLNESS: The patient is a 74 year old Caucasian female with a history of diabetes 1 with microalbuminuria, hyperlipidemia, hypothyroidism, presented to the ED with dehydration and weakness for the past 6 days. The patient is alert, awake, oriented, in no acute distress. The patient just came back from [place]. She is a psychiatric nurse. She feels psychotic and thinks somebody tried to poison her. She also has nausea, vomiting multiple times. diarrhea once. She refused to eat for the past 6 days. She feels dehydrated and weak and she has steady gait, but she denies any chest pain, palpitations, orthopnea or nocturnal dyspnea. No leg edema. No fever, chills, headache, but has dizziness. The patient denies any urine problems.   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   PAST SURGICAL HISTORY: Cholecystectomy, appendectomy, thyroidectomy, foot surgery, bilateral tubal ligation, lumbar laminectomy for herniated disk, C7 to T1 anterior cervicothoracic diskectomy, lumbar decompression and fusion, left femoral artery stent.   SOCIAL HISTORY: Denies any smoking, alcohol drinking or illicit drugs.   FAMILY HISTORY: Diabetes.   ALLERGIES: COUMADIN, COZAAR, DILANTIN, DILAUDID, LANTUS, METFORMIN, METHYLDOPA, PRADAXA, ZOCOR, TETANUS.   HOME MEDICATIONS:  1.  Vitamin D3, 5000 international units 1 tab once a day. 2.  Percocet 5/325 tablets 1 tablet b.i.d. p.r.n. 3.  Lisinopril 20 mg  p.o. daily. 4.  Humalog insulin pump.  5.  Clonidine 0.3 mg p.o. b.i.d.   REVIEW OF SYSTEMS:    CONSTITUTIONAL: The patient denies any fever, chills. No headache but has dizziness,  weakness.  EYES: No double vision or blurry vision.  EARS, NOSE, THROAT: No postnasal drip, slurred speech or dysphagia.  CARDIOVASCULAR: No chest pain, palpitations, orthopnea. No nocturnal dyspnea. No leg edema.  PULMONARY: No cough, sputum, shortness of breath or hemoptysis.  GASTROINTESTINAL: Positive for abdominal pain, nausea, vomiting, diarrhea. No melena or bloody stool.  GENITOURINARY: No dysuria, hematuria or incontinence.  SKIN: No rash or jaundice.  NEUROLOGIC: No syncope, loss of consciousness or seizure.  ENDOCRINE: No polyuria, polydipsia, heat or cold intolerance.  HEMATOLOGIC: No easy bleeding or bleeding.   PHYSICAL EXAMINATION: VITAL SIGNS: Temperature 97.4, blood pressure 96/62, pulse 79, O2 saturation 93% on room air.  GENERAL: The patient is alert, awake, oriented, in no acute distress.  HEENT: Pupils round, equal and reactive to light and accommodation. Dry oral mucosa. Clear oropharynx.  NECK: Supple. No JVD or carotid bruits. No lymphadenopathy. No thyromegaly.  CARDIOVASCULAR: S1, S2, regular rate and rhythm. No murmurs, gallops.  PULMONARY: Bilateral air entry. No wheezing or rales. No use of accessory muscles to breathe.  ABDOMEN: Soft. No distention or tenderness. No organomegaly. Bowel sounds present.  EXTREMITIES: No edema, clubbing or cyanosis. No calf tenderness. Strong bilateral pedal pulses.  SKIN: No rash or jaundice.  NEUROLOGIC: A and O x 3. No focal deficits. Power 5/5. Sensation intact.   LABORATORY AND DIAGNOSTIC DATA: Urinalysis is negative. Glucose 176, BUN 47, creatinine 2.12, sodium 132, potassium 4.4, bicarbonate 25, chloride 99. Lipase 720. WBC 11, hemoglobin 14.3, platelets 111.   EKG: First EKG  showed A. fib with RVR at 146. Second EKG showed normal sinus  rhythm at 77 BPM.   IMPRESSION: 1.  Acute renal failure, dehydration, with chronic kidney disease.  2.  Hyponatremia.  3.  Questionable pancreatitis.  4.  Paroxysmal atrial fibrillation with rapid ventricular response. 5.  Diabetes 1. 6.  Hypertension.  7.  Abnormal EKG as mentioned above.   PLAN OF TREATMENT: 1.  The patient was admitted to telemetry floor. The patient has been treated with IV fluid support with normal saline. We will follow up BMP and also given Zofran p.r.n. Follow up lipase.  2.  For paroxysmal atrial fibrillation, the patient was given aspirin. We will get an echocardiograph and cardiology consult. The patient developed atrial fibrillation with rapid ventricular response at 140s after patient was transferred to the telemetry floor. I called Dr. Darrold JunkerParaschos,  who suggested to give digoxin 0.25 mg IV 1 dose now, then give another same dose 2 hours later, and then another same dose 2 hours later IV, and then give digoxin 0.125 mg by mouth tomorrow morning. Since the patient has a low blood pressure at 96/62, I gave normal saline 500 bolus. Dr. Darrold JunkerParaschos suggested to give another dose of normal saline IV bolus. He suggested may start Cardizem 30 mg or 60 mg q.6 hours. No anticoagulation at this time.  3.  For diabetes 1, the patient will continue insulin pump, managed by herself.  4.  Hypertension: Since the patient has blood pressure on lower side, we will hold her lisinopril and clonidine.  5.  I discussed the patient's condition and the plan of treatment with the patient and the patient's daughter. The patient is a full code. I also discussed the patient's condition and the plan of treatment with Dr. Darrold JunkerParaschos and Dr. Dareen PianoAnderson.   TIME SPENT: About 66 minutes.   ____________________________ Shaune PollackQing Lydiah Pong, MD qc:jm D: 06/05/2013 20:41:56 ET T: 06/05/2013 21:33:34 ET JOB#: [id number]  cc: Shaune PollackQing Arthuro Canelo, MD, <Dictator> Shaune PollackQING Addisen Chappelle MD ELECTRONICALLY SIGNED 06/07/2013 14:53

## 2014-12-27 NOTE — Discharge Summary (Signed)
PATIENT NAME:  Lauren Moyer, Lauren Moyer MR#:  [id number] DATE OF BIRTH:  1940/09/13  DATE OF ADMISSION:  06/05/2013 DATE OF DISCHARGE:  06/06/2013  DISCHARGE DIAGNOSES: 1.  Acute renal failure, secondary to dehydration.  2.  Acute diverticulitis.  3.  Rapid atrial fibrillation, resolved.  4.  Insulin-dependent diabetes mellitus, type 1.  5.  Factor V Leiden coagulopathy.  6.  History of breast cancer.  7.  Hypertension.   DISCHARGE MEDICATIONS: Clonidine 0.2 mg b.i.d., Humalog, insulin pump; Percocet 5/325 b.i.d., p.r.n., Vitamin D3 daily, Cipro 500 mg b.i.d. x 1  week, Flagyl 500 mg t.i.d.  x 1 week.   REASON FOR ADMISSION: A 74 year old female presented with altered mental status, dehydration, and acute a-fib. Please see H and P for HPI, past medical history, and physical exam.   HOSPITAL COURSE: The patient was admitted, given IV Lanoxin for heart rate control. That, with hydration, she went back into normal sinus rhythm. She was asymptomatic. Cardiac enzymes were normal. Her creatinine came down to baseline with hydration. She initially presented with psychosis, and that resolved as well with hydration. She realizes she does not drink enough fluids.   On exam she had  left lower quadrant pain, and apparently picked this up in [place]. This is most consistent with diverticulitis. She will be treated empirically with p.o. Cipro and Flagyl.   She will follow up with Dr. Hyacinth MeekerMiller in 1 week   ____________________________ Danella PentonMark F. Miller, MD mfm:dm D: 06/06/2013 07:44:17 ET T: 06/06/2013 08:25:22 ET JOB#: [id number]  cc: Danella PentonMark F. Miller, MD, <Dictator> MARK Sherlene ShamsF MILLER MD ELECTRONICALLY SIGNED 06/08/2013 7:47

## 2014-12-28 NOTE — Consult Note (Signed)
PATIENT NAME:  Lauren Moyer, Lauren Moyer MR#:  [id number] DATE OF BIRTH:  March 12, 1941  DATE OF ADMISSION: 03/06/2014  DATE OF CONSULTATION:  03/07/2014  REFERRING PHYSICIAN: Danella Penton, MD  CONSULTING PHYSICIAN:  Keturah Barre, NP  REASON FOR CONSULTATION: For evaluation of liver lesions.   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 Medical history significant for breast cancer in 2011, treated at [facility] with radiation. Has yearly mammogram and shows no recurrence. Cannot remember provider's name. Thinks she is still in remission. Cannot say when she was seen by oncologist last. Hepatic panel unremarkable. Platelets normal. Reports having hepatitis A as a teenager. Denies other liver disease, family history of liver disease, IVDU, intranasal cocaine, history of blood transfusion, dialysis, incarceration, tattoos. Did live in [place]. Denies history of jaundice other than with her episode of hepatitis A as a teenager. Denies ascites. Denies GI complaints currently.   REVIEW OF SYSTEMS: Ten systems reviewed, agree with admitting physician with the exception of she does not have any nausea or vomiting currently.   PAST MEDICAL HISTORY: Diabetes, hyperlipidemia, hypothyroidism, PVD, factor V Leiden, hypertension, lumbar laminectomy, breast cancer status post radiation therapy, gastric sleeve in 2012, Lap-Band in 2008.   FAMILY HISTORY: Significant for diabetes. There is no family history of liver disease.   ALLERGIES: COUMADIN, COZAAR, DILANTIN, DILAUDID, LANTUS, METFORMIN, METHYLDOPA, PRADAXA, SIMVASTATIN, TETANUS TOXOID, VANCOMYCIN, ZANTAC, TAPE.   SOCIAL HISTORY: No alcohol, tobacco or drugs.   HOME MEDICATIONS: 1. Amlodipine 10 mg p.o. daily. 2. ASA 81 mg p.o. daily.  3. Vitamin D 5000 units once a day.  4.  Clonidine 0.3 mg t.i.d. 5. Multivitamin once a day.  6. Garlic once a day. 7. Hawthorn berry 500 mg once a day. 8. Humalog sliding scale subcutaneous. 9. Lecithin 1000 mg daily.  10. Lisinopril 20 mg daily.  11. Vitamin d 2000 daily.  12. Omeprazole 20 mg b.i.d. 13. Os-Cal 1250 mg once a day. 14. Percocet 5/325 two tabs daily as needed.   LABORATORY DATA: Most recent labs: Glucose 340, BUN 38, creatinine 2.37, sodium 133, chloride 99, GFR 20. LDL 186, cholesterol 278, triglycerides 286. Liver panel normal. WBC 15.6, hemoglobin 15.3, hematocrit 49.1, platelet count 263. CT with findings as above. There is a note of sutures in her distal esophagus, and this may be related to her history of gastric sleeve/Lap-Band.   PHYSICAL EXAMINATION:  VITAL SIGNS: Most recent: Temperature 98.2, pulse 83, respiratory rate 20, blood pressure 109/69, SaO2 of 95% on room air.  GENERAL: A mostly comfortable-appearing woman in no acute distress.  HEENT: Normocephalic, atraumatic. Sclerae are clear. No icterus.  NECK: Supple. No thyromegaly, JVD.  CHEST: Respirations eupneic. Lungs clear, somewhat diminished in the bases bilaterally.  CARDIAC: S1, S2, RRR. No MRG. No edema. Pedal pulses 2+.  GASTROINTESTINAL: Obese abdomen, soft. Bowel sounds x4. Nontender, nondistended. Unable to appreciate any masses or hepatosplenomegaly. There was additionally no guarding, rigidity, peritoneal signs or other abnormalities.  MUSCULOSKELETAL: Strength 5 out of 5. MAEW x4. No clubbing or edema.  NEUROLOGIC: Alert and oriented x3. Cranial nerves II through XII intact. Speech clear. No facial droop.  SKIN: Warm, dry, pink. Minimal ecchymosis. Otherwise, no erythema or rash.  PSYCHIATRIC: Pleasant, calm, cooperative.   IMPRESSION AND PLAN:  1. Hypodense liver lesions are nonspecific. May arise from several causes.  Literature indicates benign neoplasm such as hematomas, adenomas, hemangiomas, malignant neoplasm such as  hepatocellular carcinoma, metastases, lymphoma, additionally, focal nodular hyperplasia, focal fatty infiltration and cysts are some possibilities. Will assess AFP, HCV, HBV, and discuss further imaging with Dr. Shelle Ironein. Due to her history of chronic renal insufficiency, this may unfortunately limit our options to evaluate these lesions.  2. During interview, the patient did complain of some upper chest and neck discomfort. She was rather worried about her heart. There are no associated symptoms. Her vital signs were assessed and noted as above. She was hemodynamically stable. A 12-lead EKG was obtained, which demonstrated NSR. I discussed this with Dr. Hyacinth MeekerMiller, who recommended obtaining a troponin level in the morning. A few minutes later, the patient stated complete relief from these symptoms, and they did not return through the course of the interview. This was also discussed with Dr. Shelle Ironein. We reviewed the EKG together and did not find any acute issues. There will also be a heart station physician viewing this as well.   Thank you very much for this consult.   These services were provided by Vevelyn Pathristiane London, MSN, [facility], LPN[facility], in collaboration with Dow AdolphMatthew Rein, MD, with whom I have discussed this patient in full.  ____________________________ Keturah Barrehristiane H. London, NP chl:lb D: 03/08/2014 07:36:00 ET T: 03/08/2014 08:10:10 ET JOB#: [id number]  cc: Keturah Barrehristiane H. London, NP, <Dictator> Eustaquio MaizeHRISTIANE H LONDON FNP ELECTRONICALLY SIGNED 03/25/2014 11:34

## 2014-12-28 NOTE — Op Note (Signed)
PATIENT NAME:  Lauren Moyer, Lauren Moyer MR#:  [id number] DATE OF BIRTH:  12/26/1940  DATE OF PROCEDURE:  09/17/2013  PREOPERATIVE DIAGNOSIS: Intrauterine mass lesion.   POSTOPERATIVE DIAGNOSIS: Submucosal leiomyoma.   OPERATIVE PROCEDURES:  1. Hysteroscopy.  2. Dilation and curettage with myomectomy using resectoscope.   SURGEON: Prentice DockerMartin A. Gao Mitnick, M.D.   FIRST ASSISTANT: Keely Reichel, PA-S.   ANESTHESIA: General.   INDICATIONS: The patient is a 74 year old female with an intrauterine mass measuring 2 cm on ultrasound who presents for surgical management. She was not having any abnormal uterine bleeding. The patient has a past history of breast cancer and other multiple comorbidities.   FINDINGS AT SURGERY: Revealed a 2.5 cm submucosal fibroid with a broad-based stalk. The endometrial cavity was otherwise unremarkable.   DESCRIPTION OF PROCEDURE: The patient was brought to the operating room where she was placed in the supine position. General anesthesia was induced without difficulty. She was placed in the dorsal lithotomy position using [employer] stirrups. A Betadine perineal, intravaginal prep and drape was performed in standard fashion. Red Robinson catheter was used to drain 20 mL of urine from the bladder. A weighted speculum was placed into the vagina, and a single-tooth tenaculum was placed on the anterior lip of the cervix. The uterus was sounded to 8 cm. The Hanks dilators were used up to a #20 [place] caliber to dilate the endocervical canal. The [facility] hysteroscope using lactated Ringer's as irrigant was used to identify the intrauterine findings. Initial curettage with both smooth and serrated curettes and attempts at removing the fibroid with ring forceps and stone polyp forceps were unsuccessful. Decision was made to take the mass out using the resectoscope. Glycine was then replaced as irrigant with 200 mL of glycine being utilized. The resectoscope was then used to incise the  intrauterine mass lesion. Once the mass was bivalved, stone polyp forceps were used to extract the mass. Further curettage was performed, and then subsequently of the remainder of the fibroid was removed with ring forceps. Repeat hysteroscopy with lactated Ringer's as irrigant then verified that no significant residual tissue was left behind. There were no other lesions seen within the endometrial cavity. The procedure was then terminated with all instrumentation being removed from the vagina. The patient was then awakened, mobilized and taken to the recovery room in satisfactory condition.   ESTIMATED BLOOD LOSS: Less than 25 mL.   IV FLUIDS: 500 mL.   URINE OUTPUT: 20 mL.    ____________________________ Prentice DockerMartin A. Anaria Kroner, MD mad:gb D: 09/17/2013 14:23:42 ET T: 09/17/2013 21:41:29 ET JOB#: [id number]  cc: Daphine DeutscherMartin A. Jhamir Pickup, MD, <Dictator> [facility] Prentice DockerMARTIN A Suresh Audi MD ELECTRONICALLY SIGNED 09/19/2013 13:05

## 2014-12-28 NOTE — Consult Note (Signed)
ONCOLOGY followup - denies any recurrent back or hip pain. Eating steady.no abdominal pain. No new cough, dyspnea. sitting in bed, alert and oriented, NAD.          Vitals - 98.5, 74, 18, 136/60, 98% on room air          HEENT - no cervical adenopathy          Lungs - bilaterally good air entry          Abdomen - soft, nontender.  7/3 - hemoglobin 8.3, WBC 4600, ANC 3600, platelets 20K. Otherwise serum AFP, HBsAg, HCV Ab are unremarkable.  03/08/14 - MRI abdomen: IMPRESSION:  1. The examination is somewhat limited without IV contrast but the 3 liver lesions are not cysts or hemangiomas and are worrisome for metastasis.  2. Left adrenal gland lesion does not have typical MR imaging features of a benign adenoma. It could be a lipid poor adenoma or metastasis. The restricted diffusion would suggest metastasis.  3. Recommend PET-CT for further evaluation. A liver biopsy is also an option.  74 year old female patient with history of multiple medical problems admitted with back pain.  She has prior history of breast cancer in 2011 and underwent lumpectomy and [facility], reportedly had stage I breast cancer which was ER positive, but did not take any hormonal therapy or chemotherapy, but did take radiation treatment.  Currently, CT scan of the abdomen is reporting three new liver masses, the largest measuring 2.4, 2.5 cm mass in the left adrenal gland which has been stable since 2013, a 12 mm nodule in the lingula.  MRI liver also confirms these liver lesions. The patient explained about these findings and that it does raise the possibility of metastatic malignancy versus other etiology and she needs further work-up. Serum AFP is unremarkable, serum CA 27.29 level is pending.  PET scan for restaging breast cancer and to look for metastatic disease has been requested and is pending.  Will follow up after scan is done and consider biopsy if it is abnormal and try to biopsy most accessible site. Will  hold ASA, will need to wait 7 days before she can get biopsy.  The patient explained above, agreeable to this plan.  If she is discharged soon, we will arrange for outpatient follow-up next week.    Electronic Signatures: Izola PricePandit, Dennisse Swader Raj (MD)  (Signed on 04-Jul-15 17:29)  Authored  Last Updated: 04-Jul-15 17:29 by Izola PricePandit, Palmina Clodfelter Raj (MD)

## 2014-12-28 NOTE — Discharge Summary (Signed)
PATIENT NAME:  Lauren DonathRUTHERFORD, Lauren C MR#:  [id number] DATE OF BIRTH:  12-07-1940  DATE OF ADMISSION:  03/06/2014 DATE OF DISCHARGE:  03/10/2014  HISTORY OF PRESENT ILLNESS: The patient was a 74 year old lady who had noted gradually worsening back pain who had [facility] and also by her PCP. She described the pain as being 10/10. The patient also noted increased urinary frequency and urgency. The patient was seen in the ER and admitted for further evaluation.   The patient had a history of hypertension, type 2 diabetes and hyperlipidemia. She was also known to have hypothyroidism, peripheral vascular disease, and history of factor V Leiden deficiency. The patient had had previous breast cancer and was status post radiation. She had a history of a previous lumbar laminectomy.   The patient was noted to be intolerant to Coumadin, Cozaar, Dilantin, Dilaudid, Lantus, metformin, methyldopa, Pradaxa, simvastatin, tetanus toxoid, vancomycin, Zantac and paper tape.   The patient's medications on admission included aspirin 81 mg daily, Percocet 5/325 b.i.d. p.r.n., Os-Cal b.i.d., clonidine 0.3 mg t.i.d., Humalog pump, amlodipine 10 mg daily, cholecalciferol 5000 units daily, multivitamin 1 tablet daily, garlic 1000 mg daily, hawthorn berry 500 mg daily, lisinopril 20 mg daily, and Prilosec 20 mg b.i.d.   Admission physical examination, as described by the admitting physician, revealed a temperature of 98.1, a heart rate of 117, respirations 22, and a blood pressure 156/72. In general, the admitted exam was notable only for some point tenderness around L4 and L5. She also had some paraspinal muscle spasm, particularly on the right side. Straight leg raise was negative. The remainder of the exam was described as normal by the admitting physician.   Admission metabolic panel showed a sodium of 133, a potassium 4.3, and a chloride of 99. Bicarb was 24. BUN was 38 with a creatinine of 2.37. Random blood sugar was  340. White count was 15,600. Hemoglobin was 15.3. Platelet count was 263,000. Urinalysis showed 53 WBCs and 18 RBCs. Leukocyte esterase was 2+ positive. Nitrite was negative. Epithelial cells were noted. CT scan of the abdomen and pelvis revealed 3 lesions in the liver concerning for metastatic disease. There was noted to be distal esophageal thickening. There was also a 2.5 cm left adrenal mass that was stable compared to previous exams.   HOSPITAL COURSE: The patient was admitted to the regular medical floor with suspected sepsis due to UTI. She had minimally elevated troponins which was felt to be stress ischemia, but she was placed on telemetry. The patient had blood and urine cultures obtained and was started on ceftriaxone pending culture report.   The patient's urinalysis showed mixed bacterial organisms suggestive of contamination. Blood cultures eventually showed no growth. Hepatitis C antibody was negative. Hepatitis B screen was negative. Hepatitis B surface antibody was negative. Hepatitis B core antibody was negative. AFP was 1.9. Follow-up MRI of the abdomen showed the 3 liver lesions not to be cysts or hemangioma and to be worrisome for metastatic disease. The left adrenal gland lesion did not have the typical MRI imaging features of a benign adenoma. Therefore, a malignancy could not be ruled out. PET scan was recommended but unfortunately could not be performed during the current hospitalization.   The patient was seen in consultation by gastroenterology. The possibility of a liver biopsy was raised. However, in discussions with Dr. Mechele CollinElliott, this was postponed to possibly be done as an outpatient.   DISCHARGE DIAGNOSES: 1.  Urinary tract infection, sepsis ruled out.  2.  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   DISCHARGE MEDICATIONS: 1.  Humalog insulin pump.  2.  Percocet 5/325 mg 1 tablet b.i.d. as needed.  3.  Os-Cal 1250 mg tablets daily.  4.  Clonidine 0.3 mg t.i.d. 5.   Cholecalciferol 50,000 units daily.  6.  Garlic 1000 mg daily.  7.  Nattozime 2000 units daily.  8.  Hawthorn berry 500 mg daily.  9.  Lethicin 1000 mg daily.  10.  Lisinopril 20 mg daily.  11.  Omeprazole 20 mg b.i.d.  12.  Amlodipine 10 mg daily.  13.  Multivitamin 1 daily.  14.  Phenazopyridine 100 mg t.i.d. after meals.  15.  Bactrim double strength 1 tablet every 12 hours for 7 days.   DISCHARGE INSTRUCTIONS: The patient was advised to hold her aspirin at the time of discharge pending possible liver biopsy as an outpatient.   The patient was discharged on a carbohydrate-controlled diet. There was no limitations to activities. She is to follow-up with Dr. Hyacinth Meeker in 1 to 2 weeks.  ____________________________ Letta Pate Danne Harbor, MD jbw:sb D: 03/29/2014 08:06:38 ET T: 03/29/2014 08:29:45 ET JOB#: [id number]  cc: Letta Pate. Danne Harbor, MD, <Dictator> Elmo Putt III MD ELECTRONICALLY SIGNED 04/03/2014 8:00

## 2014-12-28 NOTE — Consult Note (Signed)
Details:   - GI Note:  I have seen and examined Ms Lauren Moyer and agree with Christiane London's a/p.   She does have liver lesions on the non-con CT. Need to clarify further what they are. The esophageal changes on CT are minimal and she is asymptomatic. Would follow these up as outpt and consider EGD.   Recs: - will obtain MRI to further eval liver lesions ( has to be done non-con because of CKD.) - will follow   Electronic Signatures: Dow Adolphein, Naome Brigandi (MD)  (Signed 02-Jul-15 18:18)  Authored: Details   Last Updated: 02-Jul-15 18:18 by Dow Adolphein, Azaan Leask (MD)

## 2014-12-28 NOTE — Consult Note (Signed)
Brief Consult Note: Diagnosis: back pain.   Patient was seen by consultant.   Consult note dictated.   Comments: Appreciate cx for 74 y/o [place] woman admitted with back pain, for evaluation of suspected new liver lesions: 3 hypodense areas found in segments 2, 2/3, and 6. Largest is 2.4cm near portal vein. Med Hx sig for breast cancer 2011. [facility] with [facility]. Has yearly mammograms and says no recurrence. Cannot remember provider's name. Thinks she is still in remission, cannot say when she was seen by oncologist last.  Hepatic panel unremarkable, platelets normal. 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 Denies other liverdz, fm hx liver dz, IVDU/intranasal cocaine, hx blood trans/dialysis, incarceration, tattoos. Did [place]. Denies history of jaundice other than with HAV episode, ascites. Denies GI complaints currently.  Impression/plan: Hypodense liver lesions are nonspecific. May arise from several causes: literature indicates  benign neoplasms, such as hamartomas/adenomas/hemangiomas; malignant neoplasms such as HCC, metastases, lymphoma, FNH, focal fatty infiltration, cyst, as some possibilities. Will assess AFP, HCV, HBV and discuss further imaging with Dr Shelle Ironein. Noted decreased kidney function unfotrunately likely will limit ability to use contrast  Addendum: during interview patient complained of some upper chest/neck discomfort- was worried about her heart: there were no associated sx. Her vital signs were assessed at the time and she was hemodynamically stable, a 12 ;lead EKG was obtained which demonstrated NSR. I dicsused this with Dr Hyacinth MeekerMiller, who recommended obtaining a troponin level in the am. A few minutes later, the patient stated complete relief from her discomfort and it did not return.  This was also discussed with Dr Shelle Ironein, and we reviewed the EKG- it will have a heart station physician review this as well..  Electronic Signatures: Vevelyn PatLondon, Aarvi Stotts H (NP)  (Signed 03-Jul-15  07:22)  Authored: Brief Consult Note   Last Updated: 03-Jul-15 07:22 by Keturah BarreLondon, Wilfrid Hyser H (NP)

## 2014-12-28 NOTE — Consult Note (Signed)
PATIENT NAME:  Lauren Moyer, Lauren C MR#:  [id number] DATE OF BIRTH:  07/30/1941  DATE OF CONSULTATION:  03/07/2014  REFERRING PHYSICIAN:  Dr. Bethann PunchesMark Miller CONSULTING PHYSICIAN:  Suleima Ohlendorf R. Sherrlyn HockPandit, MD  REASON FOR CONSULTATION:  Newly diagnosed liver lesions.   HISTORY OF PRESENT ILLNESS:  The patient is a 74 year old female with past medical history significant for diabetes, hypertension, hyperlipidemia, history of left breast cancer status post lumpect[facility] and [facility] the end of [facility] (per patient report, lymph nodes was negative, tumor was stage I disease and ER positive, but she did not take any hormonal therapy or chemotherapy, states that she did complete radiation treatment), history of lumbar laminectomy and fusion, history of Factor V Leiden, peripheral vascular disease, hypothyroidism who has been admitted to the hospital with significant back pain and right posterior hip pain.  The pain is doing better today.  The patient had work-up done and CT scan shows at least 3 liver lesions which reported as new and suspicious for metastatic disease.  Oncology has been consulted for this issue.  The patient otherwise states that appetite has been good and she denies any unintentional weight loss.  She does have mid to lower back pain, along with right hip pain as described above.  Denies any new headaches, imbalance, falls or loss of consciousness.  States that she has been getting mammograms on a [facility] basis [facility] and last one was in September 2014 and was reportedly unremarkable.   PAST MEDICAL HISTORY AND PAST SURGICAL HISTORY:  As in HPI.   FAMILY HISTORY:  Remarkable for diabetes, denies malignancy.   SOCIAL HISTORY:  Denies smoking, alcohol or recreational drug usage.   HOME MEDICATIONS:  Aspirin 81 mg daily, Percocet 5/325 mg two times daily as needed for pain, Os-Cal 1250 mg daily, clonidine 0.3 mg three times daily, Humalog insulin pump, amlodipine 10 mg daily,  cholecalciferol 5000 units daily, multivitamin 1 tablet daily, garlic 1000 mg daily, hawthorne berry 500 mg daily, lisinopril 20 mg daily, Prilosec 20 mg twice daily.   ALLERGIES:  COUMADIN, DILANTIN, DILAUDID, LANTUS, COZAAR, METFORMIN, METHYLDOPA, PRADAXA, SIMVASTATIN, TETANUS TOXOID, VANCOMYCIN, ZANTAC, TAPE.   REVIEW OF SYSTEMS: CONSTITUTIONAL:  Has generalized fatigue on exertion.  No fevers or chills.  No night sweats.  HEENT:  Denies any headaches or dizziness at rest.  No epistaxis, ear or jaw pain.  CARDIAC:  No angina, palpitation, orthopnea, or PND.  LUNGS:  Denies any new dyspnea or chest pain, cough or hemoptysis.  GASTROINTESTINAL:  No nausea, vomiting, or diarrhea.  No bright red blood in stools or melena.  GENITOURINARY:  No dysuria or hematuria.  MUSCULOSKELETAL:  As in HPI.  EXTREMITIES:  No progressive swelling.  SKIN:  No new rashes or pruritus.  HEMATOLOGIC:  Denies obvious bleeding issues.  NEUROLOGIC:  No new focal weakness or seizures.  ENDOCRINE:  No polyuria or polydipsia.   PHYSICAL EXAMINATION: GENERAL:  The patient is a moderately built and well-nourished individual, resting in bed, alert and oriented x 4 and converses appropriately, no acute distress.  No icterus.  VITAL SIGNS:  98.2, 83, 20, 109/69, 95% on room air.  HEENT:  Normocephalic, atraumatic.  Extraocular movements intact.  Sclerae anicteric.  NECK:  Negative for lymphadenopathy.  CARDIOVASCULAR:  S1, S2, regular rate and rhythm.  LUNGS:  Bilateral good air entry, no crepitations.  ABDOMEN:  Soft.  No hepatosplenomegaly clinically.  EXTREMITIES:  Trace edema.  No cyanosis.  SKIN:  Shows no  generalized rashes or major bruising.  LYMPHATICS:  No adenopathy in the axillary or inguinal areas.  BREASTS: exam deferred per patient request. NEUROLOGIC:  Grossly nonfocal, cranial nerves intact.   LABORATORY, DIAGNOSTIC, AND RADIOLOGICAL DATA:  LFTs unremarkable except albumin 3.4, urinalysis shows 2+  leukocyte esterase, 53 WBC, 1+ bacteria.  Urine culture no growth so far.  WBC 15,600, hemoglobin 15.3, platelets 263, creatinine 2.37.   IMPRESSION AND RECOMMENDATIONS:  A 74 year old female patient with history of multiple medical problems as described above, admitted with back pain.  She has prior history of breast cancer in 2011 and underwent lumpectomy and sentinel node study at [facility], reportedly had stage I breast cancer which was ER positive, but did not take any hormonal therapy or chemotherapy, but did complete radiation treatment.  Currently, CT scan of the abdomen is reporting three new liver masses, the largest measuring 2.4, 2.5 cm mass in the left adrenal gland which has been stable since 2013, a 12 mm nodule in the lingula.  The patient explained about these findings and that it does raise the possibility of metastatic malignancy versus other etiology and she needs further work-up.  We will get serum CA 27.29 level and also pursue PET scan for restaging breast cancer and to look for metastatic disease.  We will follow up after scan is done and then consider biopsy if it is abnormal and try to biopsy most accessible site.  The patient explained above, agreeable to this plan.  If she is discharged soon, we will arrange for outpatient follow-up next week.   Thank you for the referral, please feel free to contact me for any additional questions.   ____________________________ Maren Reamer Sherrlyn Hock, MD srp:ea D: 03/08/2014 01:01:44 ET T: 03/08/2014 01:32:59 ET JOB#: [id number]  cc: Darryll Capers R. Sherrlyn Hock, MD, <Dictator> Wille Celeste MD ELECTRONICALLY SIGNED 03/08/2014 12:10

## 2014-12-28 NOTE — H&P (Signed)
PATIENT NAME:  Lauren Moyer, Lauren Moyer MR#:  [id number] DATE OF BIRTH:  [birth year]  DATE OF ADMISSION:  03/06/2014  REFERRING PHYSICIAN:  Dr. Mayford Knife.   PRIMARY CARE PHYSICIAN:  Dr. Bethann Punches at [facility].   CHIEF COMPLAINT:  Back pain.   HISTORY OF PRESENT ILLNESS:  A 74 year old Caucasian female with past medical history of diabetes, hypertension, hyperlipidemia, presenting with back pain.  She notes gradually worsening back pain since initially 02/13/2014, located in the lumbar region, sharp in quality, 10 to 10 out of intensity, worse with movement.  Once again present since 02/13/2014 after lifting some suitcases in [place].  She has been seen at [facility] as well as her PCP for the above problem.  Had multiple medication changes, found some relief with oxycodone; however, still symptomatic despite these measures.  She denotes having nauseousness for about the same duration with emesis on several occasions, clear fluid only, nonbloody, nonbilious.  Also denotes having increased urinary frequency and urgency over the last few days.  Denies any dysuria, fevers or chills.  Complains mainly of back pain which is what brought her to the hospital, however her symptoms have improved in the Emergency Department after receiving some pain medication.   REVIEW OF SYSTEMS:  CONSTITUTIONAL:  Denies fevers, chills, however positive for fatigue, weakness.  EYES:  Denied blurry eye, double vision or eye pain.  EARS, NOSE, THROAT:  Denies tinnitus, ear pain, hearing loss.  RESPIRATORY:  Denies cough, wheeze, shortness of breath.  CARDIOVASCULAR:  Denies chest pain, palpitations, edema.  GASTROINTESTINAL:  Positive for nausea and vomiting as described above.  Denies any diarrhea or abdominal pain.  GENITOURINARY:  Positive for increased urinary frequency as well as urgency, however denies any dysuria.  ENDOCRINE:  Denies any nocturia or thyroid problems.  HEMATOLOGY AND LYMPHATIC:  Denies easy bruising or  bleeding.  SKIN:  Denies rashes or lesions.  MUSCULOSKELETAL:  Positive for back pain as described above.  However, denies any neck, shoulder, knee or hip pain, any further arthritic symptoms.  NEUROLOGIC:  Denies any paralysis or paresthesias.  PSYCHIATRIC:  Denies anxiety or depressive symptoms.  Otherwise, full review of systems performed by me is negative.   PAST MEDICAL HISTORY:  Diabetes, hyperlipidemia, hypothyroidism, peripheral vascular disease, history of Factor V Leiden as well as hypertension and history of lumbar laminectomy and fusion as well as breast cancer status post radiation therapy.   SOCIAL HISTORY:  Denies any alcohol, tobacco or drug usage.   FAMILY HISTORY:  Positive for diabetes.   ALLERGIES:  Coumadin, Cozaar, Dilantin, Dilaudid, Lantus, metformin, methyldopa, Pradaxa, simvastatin, tetanus toxoid, vancomycin, Zantac and tape.   HOME MEDICATIONS:  Include aspirin 81 mg by mouth daily, Percocet 5/325 by mouth twice daily as needed for pain, Os-Cal 1250 mg by mouth daily, clonidine 0.3 mg by mouth 3 times daily, Humalog per an insulin pump, amlodipine 10 mg by mouth daily, cholecalciferol 5000 international units by mouth daily, multivitamin 1 tab daily, garlic 1000 mg by mouth daily, Hawthorne berry 500 mg by mouth daily, lisinopril 20 mg by mouth daily, Prilosec 20 mg by mouth twice daily.   PHYSICAL EXAMINATION: VITAL SIGNS:  Temperature 98.1, heart rate 117, respirations 22, blood pressure 156/72, saturating 98% on room air.  Weight 93 kg, BMI of 33.1.  GENERAL:  Well-nourished, well-developed Caucasian female, currently in no acute distress.  HEAD:  Normocephalic, atraumatic.  EYES:  Pupils equal, round, reactive to light.  Extraocular muscles intact.  No scleral icterus.  MOUTH:  Moist mucous membranes.  Dentition intact.  No abscess noted.  EAR, NOSE, THROAT:  Clear without exudates.  No external lesions.  NECK:  Supple.  No thyromegaly.  No nodules.  No JVD.   PULMONARY:  Clear to auscultation bilaterally without wheezes, rales or rhonchi.  No accessory muscles.  Good respiratory effort.  Chest nontender to palpation.  CARDIOVASCULAR:  S1, S2, regular rate and rhythm.  No murmurs, rubs, or gallops.  No edema.  Pedal pulses 2+ bilaterally.  GASTROINTESTINAL:  Soft, nontender, nondistended.  No masses.  Positive bowel sounds.  No hepatosplenomegaly.  MUSCULOSKELETAL:  No swelling, clubbing, or edema.  Range of motion full in all extremities.  Does have some point tenderness around L4, L5 as well as paraspinal tenderness, most prominent on the right hand side; however, straight leg test are negative bilaterally.  NEUROLOGIC:  Cranial nerves II through XII intact.  No gross focal neurological deficits.  Sensation intact.  Reflexes intact.  SKIN:  No ulcerations, lesions, rashes, or cyanosis.  Skin warm, dry.  Turgor intact.  PSYCHIATRIC:  Mood and affect within normal limits.  The patient is awake, alert, oriented x 3.  Insight and judgment intact.   LABORATORY DATA:  Sodium 133, potassium 4.3, chloride 99, bicarb 24, BUN 38, creatinine 2.37, glucose 340, troponin 0.08.  WBC 15.6, hemoglobin 15.3, platelets 263.  Urinalysis, WBCs 53, RBCs 18, leukocyte esterase 2+, nitrite negative, epithelial cells 5.  CT abdomen and pelvis performed revealing three new liver lesions concerning for metastatic disease, distal esophageal thickening, a 12 mm nodule in the lingula, a 2.5 cm left adrenal mass which appears to be stable.   ASSESSMENT AND PLAN:  A 74 year old Caucasian female with history of diabetes, hypertension, presenting with back pain.  1.  Sepsis:  Meeting septic criteria by heart rate and leukocytosis, likely urinary source/urinary tract infection.  Panculture including blood and urine, antibiotic coverage with ceftriaxone, follow culture data, intravenous fluid hydration to keep mean arterial pressure greater than 65.  2.  Elevated troponin without chest  pain, place on telemetry.  Trend cardiac enzymes.  Continue aspirin and statin therapy.  3.  Liver lesions of unclear etiology.  History of breast cancer, concern for malignancy.  We will check liver function tests and will need follow-up of these lesions likely as outpatient.  4.  Hypertension.  Add hydralazine as needed.  Continue with Norvasc, clonidine, lisinopril.  5.  Diabetes.  Insulin sliding scale with q. 6 hour Accu-Cheks.  6.  Lumbago with history of back surgery.  No neurological deficits on exam.  Supportive measures.  7.  Venous thromboembolism prophylaxis with heparin subQ.   8.  CODE STATUS:  THE PATIENT IS A FULL CODE.   TIME SPENT:  45 minutes.    ____________________________ Cletis Athensavid K. Johnmatthew Solorio, MD dkh:ea D: 03/07/2014 00:05:28 ET T: 03/07/2014 00:32:02 ET JOB#: [id number]  cc: Cletis Athensavid K. Lorea Kupfer, MD, <Dictator> Megumi Treaster Synetta ShadowK Delrose Rohwer MD ELECTRONICALLY SIGNED 03/07/2014 2:13
